# Patient Record
Sex: Female | Born: 1990 | Race: White | Hispanic: No | Marital: Single | Smoking: Current every day smoker
Health system: Southern US, Community
[De-identification: ages and names within clinical notes are randomized; demographics above are authoritative.]

---
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
## 2012-11-19 ENCOUNTER — Other Ambulatory Visit: Payer: Self-pay | Admitting: Family Medicine

## 2012-11-19 DIAGNOSIS — M549 Dorsalgia, unspecified: Secondary | ICD-10-CM

## 2012-11-20 ENCOUNTER — Ambulatory Visit (INDEPENDENT_AMBULATORY_CARE_PROVIDER_SITE_OTHER): Payer: Self-pay

## 2012-11-20 DIAGNOSIS — M549 Dorsalgia, unspecified: Secondary | ICD-10-CM

## 2012-11-20 DIAGNOSIS — M5126 Other intervertebral disc displacement, lumbar region: Secondary | ICD-10-CM

## 2012-11-20 DIAGNOSIS — R937 Abnormal findings on diagnostic imaging of other parts of musculoskeletal system: Secondary | ICD-10-CM

## 2012-11-21 ENCOUNTER — Other Ambulatory Visit: Payer: Self-pay | Admitting: Internal Medicine

## 2012-12-03 ENCOUNTER — Encounter: Payer: Self-pay | Admitting: Sports Medicine

## 2012-12-03 ENCOUNTER — Ambulatory Visit (INDEPENDENT_AMBULATORY_CARE_PROVIDER_SITE_OTHER): Payer: Self-pay | Admitting: Sports Medicine

## 2012-12-03 VITALS — BP 134/72 | HR 97 | Wt 174.0 lb

## 2012-12-03 DIAGNOSIS — M47816 Spondylosis without myelopathy or radiculopathy, lumbar region: Secondary | ICD-10-CM | POA: Insufficient documentation

## 2012-12-03 DIAGNOSIS — M545 Low back pain, unspecified: Secondary | ICD-10-CM

## 2012-12-03 DIAGNOSIS — M5136 Other intervertebral disc degeneration, lumbar region with discogenic back pain only: Secondary | ICD-10-CM

## 2012-12-03 MED ORDER — NAPROXEN 500 MG PO TABS: 500.0000 mg | ORAL_TABLET | Freq: Two times a day (BID) | ORAL | Status: DC
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
## 2012-12-03 NOTE — Assessment & Plan Note (Signed)
She has failed conservative measures in terms of physical therapy and steroids, gabapentin, Ultram. 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 At this point we'll proceed with interventional treatment at the L4-L5 level. We certainly need to keep her SI joint as well as left L4-L5 facet joint in her mind as possible pain generating structures. She'll come back after the injection.

## 2012-12-04 ENCOUNTER — Ambulatory Visit
Admission: RE | Admit: 2012-12-04 | Discharge: 2012-12-04 | Disposition: A | Discharge status: Home / Self Care | Payer: No Typology Code available for payment source | Source: Ambulatory Visit | Attending: Sports Medicine | Admitting: Sports Medicine

## 2012-12-04 MED ORDER — METHYLPREDNISOLONE ACETATE 40 MG/ML INJ SUSP (RADIOLOG
120.0000 mg | Freq: Once | INTRAMUSCULAR | Status: AC
  Administered 2012-12-04: 120 mg via EPIDURAL

## 2012-12-04 MED ORDER — IOHEXOL 180 MG/ML  SOLN
1.0000 mL | Freq: Once | INTRAMUSCULAR | Status: AC | PRN
  Administered 2012-12-04: 1 mL via EPIDURAL

## 2012-12-11 ENCOUNTER — Encounter: Payer: Self-pay | Admitting: *Deleted

## 2012-12-11 ENCOUNTER — Ambulatory Visit (INDEPENDENT_AMBULATORY_CARE_PROVIDER_SITE_OTHER): Payer: Self-pay | Admitting: Sports Medicine

## 2012-12-11 ENCOUNTER — Encounter: Payer: Self-pay | Admitting: Sports Medicine

## 2012-12-11 VITALS — BP 117/68 | HR 107 | Wt 169.0 lb

## 2012-12-11 DIAGNOSIS — M545 Low back pain, unspecified: Secondary | ICD-10-CM

## 2012-12-11 NOTE — Assessment & Plan Note (Signed)
I think at this point we now need to proceed with an interventional left-sided L4-L5 facet injection. She got no benefit, not even temporarily after the epidural. Her pain is now described as sharp in the left side, worse with extension. I will see her back after the left-sided L4-L5 facet block.

## 2012-12-11 NOTE — Progress Notes (Signed)
   Subjective:    CC: Follow up after injection  HPI: This pleasant 22 year old female with degenerative disc disease comes back after L4-L5 interlaminar epidural steroid injection.  Unfortunately she did not note any benefit not even for 5 or 10 minutes after the procedure. She had increasing pain, and then a few days later he returned back to her baseline level. She now continues to describe her pain as sharp, localized in the left side of the low back, worse with extension now, radiating down into the buttock but not past the knee. Her current medications are providing her with some relief. Pain is moderate, stable.  Past medical history, Surgical history, Family history not pertinant except as noted below, Social history, Allergies, and medications have been entered into the medical record, reviewed, and no changes needed.   Review of Systems: No headache, visual changes, nausea, vomiting, diarrhea, constipation, dizziness, abdominal pain, skin rash, fevers, chills, night sweats, weight loss, swollen lymph nodes, body aches, joint swelling, muscle aches, chest pain, shortness of breath, mood changes, visual or auditory hallucinations.   Objective:   General: Well Developed, well nourished, and in no acute distress.  Neuro/Psych: Alert and oriented x3, extra-ocular muscles intact, able to move all 4 extremities, sensation grossly intact. Skin: Warm and dry, no rashes noted.  Respiratory: Not using accessory muscles, speaking in full sentences, trachea midline.  Cardiovascular: Pulses palpable, no extremity edema. Abdomen: Does not appear distended. Back Exam:  Inspection: Unremarkable  Motion: I was able to reproduce the pain with left rotation and extension. SLR laying: Negative  XSLR laying: Negative  Palpable tenderness: None. FABER: negative. Sensory change: Gross sensation intact to all lumbar and sacral dermatomes.  Reflexes: 2+ at both patellar tendons, 2+ at achilles tendons,  Babinski's downgoing.  Strength at foot  Plantar-flexion: 5/5 Dorsi-flexion: 5/5 Eversion: 5/5 Inversion: 5/5  Leg strength  Quad: 5/5 Hamstring: 5/5 Hip flexor: 5/5 Hip abductors: 5/5  Gait unremarkable. I again reviewed the MRI in this patient with transitional anatomy, she does have a disc protrusion at the L4-L5 level based on numbering from the MRI, she also has left-sided L4-L5 facet spondylosis.   Impression and Recommendations:   This case required medical decision making of moderate complexity.

## 2012-12-19 ENCOUNTER — Ambulatory Visit
Admission: RE | Admit: 2012-12-19 | Discharge: 2012-12-19 | Disposition: A | Discharge status: Home / Self Care | Payer: Self-pay | Source: Ambulatory Visit | Attending: Sports Medicine | Admitting: Sports Medicine

## 2012-12-19 MED ORDER — IOHEXOL 180 MG/ML  SOLN
1.0000 mL | Freq: Once | INTRAMUSCULAR | Status: AC | PRN
  Administered 2012-12-19: 1 mL via INTRA_ARTICULAR

## 2012-12-26 ENCOUNTER — Encounter: Payer: Self-pay | Admitting: Sports Medicine

## 2012-12-26 ENCOUNTER — Ambulatory Visit (INDEPENDENT_AMBULATORY_CARE_PROVIDER_SITE_OTHER): Payer: Self-pay | Admitting: Sports Medicine

## 2012-12-26 VITALS — BP 123/70 | HR 86 | Wt 180.0 lb

## 2012-12-26 DIAGNOSIS — M545 Low back pain, unspecified: Secondary | ICD-10-CM

## 2012-12-26 NOTE — Progress Notes (Signed)
   Subjective:    CC: Followup  HPI: Low back pain: I have been seeing Yoshi for pain in her back. She has been through at least 6 sessions of formal physical therapy, oral steroids, NSAIDs, muscle relaxers. We did a L4-L5 interlaminar epidural steroid injection which caused no relief in her pain. Most recently we targeted the left L4-L5 facet joint, she had greater than 1-1/2 days of complete pain relief, and now at least 50% is not 80% percent pain relief on other days.  Unfortunately her pain is returning. Again, she localizes the pain in the left low back, worse with extension, without radiation.  Past medical history, Surgical history, Family history not pertinant except as noted below, Social history, Allergies, and medications have been entered into the medical record, reviewed, and no changes needed.   Review of Systems: No headache, visual changes, nausea, vomiting, diarrhea, constipation, dizziness, abdominal pain, skin rash, fevers, chills, night sweats, weight loss, swollen lymph nodes, body aches, joint swelling, muscle aches, chest pain, shortness of breath, mood changes, visual or auditory hallucinations.   Objective:   General: Well Developed, well nourished, and in no acute distress.  Neuro/Psych: Alert and oriented x3, extra-ocular muscles intact, able to move all 4 extremities, sensation grossly intact. Skin: Warm and dry, no rashes noted.  Respiratory: Not using accessory muscles, speaking in full sentences, trachea midline.  Cardiovascular: Pulses palpable, no extremity edema. Abdomen: Does not appear distended. Back Exam:  Inspection: Unremarkable  Motion: I was able to reproduce the pain with left rotation and extension. SLR laying: Negative  XSLR laying: Negative  Palpable tenderness: None. FABER: negative. Sensory change: Gross sensation intact to all lumbar and sacral dermatomes.  Reflexes: 2+ at both patellar tendons, 2+ at achilles tendons, Babinski's  downgoing.  Strength at foot  Plantar-flexion: 5/5 Dorsi-flexion: 5/5 Eversion: 5/5 Inversion: 5/5  Leg strength  Quad: 5/5 Hamstring: 5/5 Hip flexor: 5/5 Hip abductors: 5/5  Gait unremarkable. I again reviewed the MRI in this patient with transitional anatomy, she does have a disc protrusion at the L4-L5 level based on numbering from the MRI, she also has left-sided L4-L5 facet spondylosis.  Impression and Recommendations:   This case required medical decision making of moderate complexity.

## 2012-12-26 NOTE — Assessment & Plan Note (Addendum)
Patient has already had formal physical therapy. Unsuccessful L4-L5 epidural steroid injection Subsequently, good relief, temporary, for left-sided L4-L5 facet injection based on current MRI numbering scheme. This confirms the left-sided L4-L5 facet as the pain generating structure. I am going to initiate the process of medial branch block and radiofrequency ablation.

## 2012-12-27 ENCOUNTER — Other Ambulatory Visit: Payer: Self-pay | Admitting: Sports Medicine

## 2012-12-27 DIAGNOSIS — M545 Low back pain, unspecified: Secondary | ICD-10-CM

## 2013-01-02 ENCOUNTER — Ambulatory Visit
Admission: RE | Admit: 2013-01-02 | Discharge: 2013-01-02 | Disposition: A | Discharge status: Home / Self Care | Payer: No Typology Code available for payment source | Source: Ambulatory Visit | Attending: Sports Medicine | Admitting: Sports Medicine

## 2013-01-02 ENCOUNTER — Other Ambulatory Visit: Payer: Self-pay | Admitting: Sports Medicine

## 2013-01-02 DIAGNOSIS — M545 Low back pain, unspecified: Secondary | ICD-10-CM

## 2013-01-02 MED ORDER — IOHEXOL 180 MG/ML  SOLN
1.0000 mL | Freq: Once | INTRAMUSCULAR | Status: AC | PRN
  Administered 2013-01-02: 1 mL via INTRA_ARTICULAR

## 2013-01-07 ENCOUNTER — Encounter: Payer: Self-pay | Admitting: *Deleted

## 2013-01-22 ENCOUNTER — Telehealth: Payer: Self-pay | Admitting: *Deleted

## 2013-01-22 NOTE — Telephone Encounter (Signed)
Just got phone call Friday about doing procedure and its not even scheduled yet.

## 2013-01-22 NOTE — Telephone Encounter (Signed)
I will write a note keeping her up for another 2 weeks. It will be in my out basket.

## 2013-01-22 NOTE — Telephone Encounter (Signed)
When does she want to to say return to work.

## 2013-01-22 NOTE — Telephone Encounter (Signed)
Pt calls and wants to know if she could get an extension on her out of work note?

## 2013-01-23 NOTE — Telephone Encounter (Signed)
LMOM that she can come by office and pick up work note. Barry Dienes, LPN

## 2013-02-01 ENCOUNTER — Telehealth: Payer: Self-pay | Admitting: *Deleted

## 2013-02-01 ENCOUNTER — Ambulatory Visit
Admission: RE | Admit: 2013-02-01 | Discharge: 2013-02-01 | Disposition: A | Discharge status: Home / Self Care | Payer: No Typology Code available for payment source | Source: Ambulatory Visit | Attending: Sports Medicine | Admitting: Sports Medicine

## 2013-02-01 VITALS — BP 119/79 | HR 90 | Resp 18

## 2013-02-01 DIAGNOSIS — M545 Low back pain, unspecified: Secondary | ICD-10-CM

## 2013-02-01 MED ORDER — CEFAZOLIN SODIUM 1-5 GM-% IV SOLN: 1.0000 g | Freq: Once | INTRAVENOUS | Status: DC

## 2013-02-01 MED ORDER — FENTANYL CITRATE 0.05 MG/ML IJ SOLN
25.0000 ug | INTRAMUSCULAR | Status: DC | PRN
  Administered 2013-02-01 (×2): 50 ug via INTRAVENOUS

## 2013-02-01 MED ORDER — KETOROLAC TROMETHAMINE 30 MG/ML IJ SOLN
30.0000 mg | Freq: Once | INTRAMUSCULAR | Status: AC
  Administered 2013-02-01: 30 mg via INTRAVENOUS

## 2013-02-01 MED ORDER — CEFAZOLIN SODIUM-DEXTROSE 2-3 GM-% IV SOLR: 2.0000 g | Freq: Once | INTRAVENOUS | Status: DC

## 2013-02-01 MED ORDER — MIDAZOLAM HCL 2 MG/2ML IJ SOLN
1.0000 mg | INTRAMUSCULAR | Status: DC | PRN
  Administered 2013-02-01 (×2): 1 mg via INTRAVENOUS
  Administered 2013-02-01: 0.5 mg via INTRAVENOUS

## 2013-02-01 MED ORDER — SODIUM CHLORIDE 0.9 % IV SOLN
Freq: Once | INTRAVENOUS | Status: AC
  Administered 2013-02-01: 10:00:00 via INTRAVENOUS

## 2013-02-01 NOTE — Progress Notes (Signed)
Patient ambulating slowly but without difficulty.  jkl

## 2013-02-01 NOTE — Progress Notes (Signed)
Discharge instructions explained. 

## 2013-02-01 NOTE — Telephone Encounter (Signed)
Pt calls and states that she is another work note- hers will run out Tuesday and does not have appt yet for injections. Barry Dienes, LPN

## 2013-02-01 NOTE — Telephone Encounter (Signed)
Since we have had this problem multiple times I have written a note with an open-ended return to work day, she just needs to let me know her return to work date and I will write a specific note for that. Until then, her note is in my outbox.

## 2013-02-01 NOTE — Progress Notes (Signed)
Mitch at bedside.  Patient resting quietly after procedure.  jkl

## 2013-02-05 NOTE — Telephone Encounter (Signed)
Left VM of MD instructions. Barry Dienes, LPN

## 2013-02-07 ENCOUNTER — Telehealth: Payer: Self-pay | Admitting: Radiology

## 2013-02-07 NOTE — Telephone Encounter (Signed)
Pt asked if it was a side effect of the procedure to be nauseated. Explained that that was not a side effect we usually saw. Also, wanted to know how long she would be sore. Explained it depended on person.some were only sore about a week, some it could take as long as 3 weeks.

## 2013-02-08 ENCOUNTER — Ambulatory Visit: Payer: Self-pay | Admitting: Sports Medicine

## 2013-02-12 ENCOUNTER — Encounter: Payer: Self-pay | Admitting: Sports Medicine

## 2013-02-12 ENCOUNTER — Ambulatory Visit (INDEPENDENT_AMBULATORY_CARE_PROVIDER_SITE_OTHER): Payer: Self-pay | Admitting: Sports Medicine

## 2013-02-12 VITALS — BP 116/79 | HR 103 | Wt 181.0 lb

## 2013-02-12 DIAGNOSIS — M545 Low back pain: Secondary | ICD-10-CM

## 2013-02-12 DIAGNOSIS — M5459 Other low back pain: Secondary | ICD-10-CM

## 2013-02-12 DIAGNOSIS — M5380 Other specified dorsopathies, site unspecified: Secondary | ICD-10-CM

## 2013-02-12 MED ORDER — PREDNISONE 50 MG PO TABS: ORAL_TABLET | ORAL | Status: DC

## 2013-02-12 MED ORDER — PROMETHAZINE HCL 25 MG PO TABS: 25.0000 mg | ORAL_TABLET | Freq: Four times a day (QID) | ORAL | Status: DC | PRN

## 2013-02-12 MED ORDER — HYDROCODONE-ACETAMINOPHEN 5-325 MG PO TABS: 1.0000 | ORAL_TABLET | Freq: Three times a day (TID) | ORAL | Status: DC | PRN

## 2013-02-12 MED ORDER — CYCLOBENZAPRINE HCL 10 MG PO TABS: 10.0000 mg | ORAL_TABLET | Freq: Three times a day (TID) | ORAL | Status: DC | PRN

## 2013-02-12 NOTE — Progress Notes (Signed)
  Subjective:    CC: Followup  HPI: Lumbar facetogenic pain: Bonny is now status post radiofrequency ablation of the left L4-L5 facet joint. She is currently a little sore, but overall the sharp, facetogenic pain has resolved. She does need some more medication.  Past medical history, Surgical history, Family history not pertinant except as noted below, Social history, Allergies, and medications have been entered into the medical record, reviewed, and no changes needed.   Review of Systems: No fevers, chills, night sweats, weight loss, chest pain, or shortness of breath.   Objective:    General: Well Developed, well nourished, and in no acute distress.  Neuro: Alert and oriented x3, extra-ocular muscles intact, sensation grossly intact.  HEENT: Normocephalic, atraumatic, pupils equal round reactive to light, neck supple, no masses, no lymphadenopathy, thyroid nonpalpable.  Skin: Warm and dry, no rashes. Cardiac: Regular rate and rhythm, no murmurs rubs or gallops, no lower extremity edema.  Respiratory: Clear to auscultation bilaterally. Not using accessory muscles, speaking in full sentences.  Impression and Recommendations:

## 2013-02-12 NOTE — Assessment & Plan Note (Addendum)
Status post left-sided L4-L5 radiofrequency ablation. Pain is improved although she is having a little bit of spasm. This is expected for several weeks after the procedure. She can stop her gabapentin naproxen. I think that the spasm is probably normal after the radiofrequency ablation. I going to give her a small amount of hydrocodone, and Flexeril, as well as 5 days of prednisone. She'll come back to see me in one month.

## 2013-03-12 ENCOUNTER — Ambulatory Visit (INDEPENDENT_AMBULATORY_CARE_PROVIDER_SITE_OTHER): Payer: Self-pay | Admitting: Sports Medicine

## 2013-03-12 ENCOUNTER — Encounter: Payer: Self-pay | Admitting: Sports Medicine

## 2013-03-12 VITALS — BP 124/85 | HR 93 | Wt 185.0 lb

## 2013-03-12 DIAGNOSIS — M5380 Other specified dorsopathies, site unspecified: Secondary | ICD-10-CM

## 2013-03-12 DIAGNOSIS — M545 Low back pain: Secondary | ICD-10-CM

## 2013-03-12 DIAGNOSIS — M5459 Other low back pain: Secondary | ICD-10-CM

## 2013-03-12 MED ORDER — AMITRIPTYLINE HCL 50 MG PO TABS: ORAL_TABLET | ORAL | Status: DC

## 2013-03-12 NOTE — Progress Notes (Signed)
  Subjective:    CC: Followup  HPI: Low back pain: I have been seeing Debbie Gross for some time now, she had a left-sided L4-L5 facet radiofrequency ablation provided about a month and a half relief, unfortunately she continues to have pain which she localizes actually on both sides of her low back, hips, and upper back and shoulders. She also notes that her mood has been somewhat depressed and she's having difficulty finding a job. Symptoms are moderate, persistent. She denies any suicidal or homicidal ideation, notes symptoms are worse at that time when she has difficulty sleeping, notes a poor energy, poor concentration. She does have a primary care provider who she has not yet seen for this.  Past medical history, Surgical history, Family history not pertinant except as noted below, Social history, Allergies, and medications have been entered into the medical record, reviewed, and no changes needed.   Review of Systems: No fevers, chills, night sweats, weight loss, chest pain, or shortness of breath.   Objective:    General: Well Developed, well nourished, and in no acute distress.  Neuro: Alert and oriented x3, extra-ocular muscles intact, sensation grossly intact.  HEENT: Normocephalic, atraumatic, pupils equal round reactive to light, neck supple, no masses, no lymphadenopathy, thyroid nonpalpable.  Skin: Warm and dry, no rashes. Cardiac: Regular rate and rhythm, no murmurs rubs or gallops, no lower extremity edema.  Respiratory: Clear to auscultation bilaterally. Not using accessory muscles, speaking in full sentences. Back Exam:  Inspection: Unremarkable  Motion: Flexion 45 deg, Extension 45 deg, Side Bending to 45 deg bilaterally,  Rotation to 45 deg bilaterally  SLR laying: Negative  XSLR laying: Negative  Palpable tenderness: None. FABER: negative. Sensory change: Gross sensation intact to all lumbar and sacral dermatomes.  Reflexes: 2+ at both patellar tendons, 2+ at achilles  tendons, Babinski's downgoing.  Strength at foot  Plantar-flexion: 5/5 Dorsi-flexion: 5/5 Eversion: 5/5 Inversion: 5/5  Leg strength  Quad: 5/5 Hamstring: 5/5 Hip flexor: 5/5 Hip abductors: 5/5  Gait unremarkable. Impression and Recommendations:

## 2013-03-12 NOTE — Assessment & Plan Note (Addendum)
Good response for a month and a half with L4-L5 left-sided radiofrequency denervation of the facet joint. Unfortunately some of her pain is coming back. I do think a large amount of her symptoms are coming from uncontrolled mood disorder. She will followup with her primary care provider for this. I am going to start Elavil at bedtime. Return in one month.

## 2013-04-09 ENCOUNTER — Ambulatory Visit: Payer: Self-pay | Admitting: Sports Medicine

## 2013-04-24 ENCOUNTER — Ambulatory Visit (INDEPENDENT_AMBULATORY_CARE_PROVIDER_SITE_OTHER): Payer: Self-pay | Admitting: Sports Medicine

## 2013-04-24 ENCOUNTER — Encounter: Payer: Self-pay | Admitting: Sports Medicine

## 2013-04-24 VITALS — BP 102/73 | HR 78 | Wt 184.0 lb

## 2013-04-24 DIAGNOSIS — M5380 Other specified dorsopathies, site unspecified: Secondary | ICD-10-CM

## 2013-04-24 DIAGNOSIS — M5459 Other low back pain: Secondary | ICD-10-CM

## 2013-04-24 DIAGNOSIS — M545 Low back pain: Secondary | ICD-10-CM

## 2013-04-24 MED ORDER — CITALOPRAM HYDROBROMIDE 20 MG PO TABS: 20.0000 mg | ORAL_TABLET | Freq: Every day | ORAL | Status: DC

## 2013-04-24 NOTE — Progress Notes (Signed)
  Subjective:    CC: Follow up  HPI: Low back pain: Status post epidural injection as well as left-sided L4-L5 facet radiofrequency ablation which he for only a few days of improvement. I thought that disorder was contributory her pain, and after followup with her primary care provider regarding this. She has not yet been back to see her primary care provider, and does endorse depression without suicidality. Pain continues in the back radiating down the lateral aspect of the left buttock, but not past the knee. Moderate, persistent.  Past medical history, Surgical history, Family history not pertinant except as noted below, Social history, Allergies, and medications have been entered into the medical record, reviewed, and no changes needed.   Review of Systems: No fevers, chills, night sweats, weight loss, chest pain, or shortness of breath.   Objective:    General: Well Developed, well nourished, and in no acute distress.  Neuro: Alert and oriented x3, extra-ocular muscles intact, sensation grossly intact.  HEENT: Normocephalic, atraumatic, pupils equal round reactive to light, neck supple, no masses, no lymphadenopathy, thyroid nonpalpable.  Skin: Warm and dry, no rashes. Cardiac: Regular rate and rhythm, no murmurs rubs or gallops, no lower extremity edema.  Respiratory: Clear to auscultation bilaterally. Not using accessory muscles, speaking in full sentences. Back Exam:  Inspection: Unremarkable  Motion: Flexion 45 deg, Extension 45 deg, Side Bending to 45 deg bilaterally,  Rotation to 45 deg bilaterally  SLR laying: Negative  XSLR laying: Negative  Palpable tenderness: None. FABER: negative. Sensory change: Gross sensation intact to all lumbar and sacral dermatomes.  Reflexes: 2+ at both patellar tendons, 2+ at achilles tendons, Babinski's downgoing.  Strength at foot  Plantar-flexion: 5/5 Dorsi-flexion: 5/5 Eversion: 5/5 Inversion: 5/5  Leg strength  Quad: 5/5 Hamstring: 5/5  Hip flexor: 5/5 Hip abductors: 5/5  Gait unremarkable.   Impression and Recommendations:

## 2013-04-24 NOTE — Assessment & Plan Note (Signed)
Persistent back pain despite left L4-L5 facet radiofrequency ablation. No response to amitriptyline, discontinuing this. She's not yet seen her primary care provider regarding mood disorder, I am going to start citalopram and she will follow this up with her PCP. She will ice her back and use naproxen. Return to see me in about 3 weeks.  She did want to discuss her knee, I'm going to provide some exercises and we will discuss this further at a future visit.

## 2013-05-15 ENCOUNTER — Ambulatory Visit (INDEPENDENT_AMBULATORY_CARE_PROVIDER_SITE_OTHER): Payer: Self-pay | Admitting: Sports Medicine

## 2013-05-15 ENCOUNTER — Encounter: Payer: Self-pay | Admitting: Sports Medicine

## 2013-05-15 VITALS — BP 115/75 | HR 74 | Wt 186.0 lb

## 2013-05-15 DIAGNOSIS — M5380 Other specified dorsopathies, site unspecified: Secondary | ICD-10-CM

## 2013-05-15 DIAGNOSIS — M545 Low back pain: Secondary | ICD-10-CM

## 2013-05-15 DIAGNOSIS — M5459 Other low back pain: Secondary | ICD-10-CM

## 2013-05-15 NOTE — Assessment & Plan Note (Signed)
Good response to citalopram, happier, less stressed out, back pain has improved. She is going to make a followup with her primary care provider for continuation and followup with this medication. She certainly has lumbar facet spondylosis, but it sounds as though a great deal of her pain may be myofascial as well.

## 2013-05-15 NOTE — Progress Notes (Signed)
  Subjective:    CC: Follow up  HPI: Low back pain:  Moderate response after L4-5 facet radiofrequency ablation, unfortunately she continued to have pain. She did have some symptoms suggestive of depression, base on the neurotransmitter hypothesis I added citalopram, and she returns almost pain free, injury happy.  Past medical history, Surgical history, Family history not pertinant except as noted below, Social history, Allergies, and medications have been entered into the medical record, reviewed, and no changes needed.   Review of Systems: No fevers, chills, night sweats, weight loss, chest pain, or shortness of breath.   Objective:    General: Well Developed, well nourished, and in no acute distress.  Neuro: Alert and oriented x3, extra-ocular muscles intact, sensation grossly intact.  HEENT: Normocephalic, atraumatic, pupils equal round reactive to light, neck supple, no masses, no lymphadenopathy, thyroid nonpalpable.  Skin: Warm and dry, no rashes. Cardiac: Regular rate and rhythm, no murmurs rubs or gallops, no lower extremity edema.  Respiratory: Clear to auscultation bilaterally. Not using accessory muscles, speaking in full sentences.  Impression and Recommendations:

## 2013-06-26 ENCOUNTER — Ambulatory Visit (INDEPENDENT_AMBULATORY_CARE_PROVIDER_SITE_OTHER): Payer: Self-pay | Admitting: Sports Medicine

## 2013-06-26 ENCOUNTER — Encounter: Payer: Self-pay | Admitting: Sports Medicine

## 2013-06-26 VITALS — BP 117/74 | HR 87 | Wt 185.0 lb

## 2013-06-26 DIAGNOSIS — F329 Major depressive disorder, single episode, unspecified: Secondary | ICD-10-CM

## 2013-06-26 DIAGNOSIS — IMO0001 Reserved for inherently not codable concepts without codable children: Secondary | ICD-10-CM

## 2013-06-26 DIAGNOSIS — M7918 Myalgia, other site: Secondary | ICD-10-CM | POA: Insufficient documentation

## 2013-06-26 MED ORDER — PREGABALIN 50 MG PO CAPS: 50.0000 mg | ORAL_CAPSULE | Freq: Two times a day (BID) | ORAL | Status: DC

## 2013-06-26 NOTE — Assessment & Plan Note (Signed)
Increasing to 30 mg of citalopram. She has still not yet seen her PCP, but will follow this up with her.

## 2013-06-26 NOTE — Assessment & Plan Note (Signed)
Breane has now had insufficient responses to an epidural as well as facet blocks as well as facet radiofrequency ablation. Her pain did improve with starting citalopram, we are going to increase to 30 mg daily, she will continue to follow this up with her PCP. I'm also going to add some Lyrica samples, like to see her back in one month to see if things are going, she can also call me and let me know when she needs refills.

## 2013-06-26 NOTE — Progress Notes (Signed)
  Subjective:    CC: Followup  HPI: Low back pain: Debbie Gross has been through a multitude of scans, including MRI, she's had a multitude of invasive interventional procedures including epidurals, facet injections, and radiofrequency ablation, unfortunately her pain is persistent. More recently I started her on citalopram, and this improved her back pain. She's currently fairly happy but does still endorse some symptoms of depressed mood, poor concentration, anhedonia, but denies suicidal or homicidal ideation. Her back pain is improved, but is persistent, and she localizes it on both shoulders, both sides of her low back, and hips.  Impression: Improved significantly with 20 mg of citalopram, she is amenable to going up to 30.  Past medical history, Surgical history, Family history not pertinant except as noted below, Social history, Allergies, and medications have been entered into the medical record, reviewed, and no changes needed.   Review of Systems: No fevers, chills, night sweats, weight loss, chest pain, or shortness of breath.   Objective:    General: Well Developed, well nourished, and in no acute distress.  Neuro: Alert and oriented x3, extra-ocular muscles intact, sensation grossly intact.  HEENT: Normocephalic, atraumatic, pupils equal round reactive to light, neck supple, no masses, no lymphadenopathy, thyroid nonpalpable.  Skin: Warm and dry, no rashes. Cardiac: Regular rate and rhythm, no murmurs rubs or gallops, no lower extremity edema.  Respiratory: Clear to auscultation bilaterally. Not using accessory muscles, speaking in full sentences.  Impression and Recommendations:

## 2013-07-29 ENCOUNTER — Ambulatory Visit: Payer: Self-pay | Admitting: Sports Medicine

## 2013-08-14 ENCOUNTER — Ambulatory Visit: Payer: Self-pay | Admitting: Sports Medicine

## 2017-03-27 ENCOUNTER — Ambulatory Visit (INDEPENDENT_AMBULATORY_CARE_PROVIDER_SITE_OTHER): Payer: Medicaid Other

## 2017-03-27 ENCOUNTER — Ambulatory Visit (INDEPENDENT_AMBULATORY_CARE_PROVIDER_SITE_OTHER): Payer: Medicaid Other | Admitting: Sports Medicine

## 2017-03-27 ENCOUNTER — Encounter: Payer: Self-pay | Admitting: Sports Medicine

## 2017-03-27 DIAGNOSIS — M47816 Spondylosis without myelopathy or radiculopathy, lumbar region: Secondary | ICD-10-CM | POA: Diagnosis not present

## 2017-03-27 DIAGNOSIS — G8929 Other chronic pain: Secondary | ICD-10-CM | POA: Diagnosis not present

## 2017-03-27 DIAGNOSIS — M545 Low back pain: Secondary | ICD-10-CM

## 2017-03-27 DIAGNOSIS — R200 Anesthesia of skin: Secondary | ICD-10-CM | POA: Diagnosis not present

## 2017-03-27 DIAGNOSIS — G5603 Carpal tunnel syndrome, bilateral upper limbs: Secondary | ICD-10-CM | POA: Diagnosis not present

## 2017-03-27 MED ORDER — PREDNISONE 50 MG PO TABS: ORAL_TABLET | ORAL | 0 refills | Status: DC

## 2017-03-27 MED ORDER — MELOXICAM 15 MG PO TABS: ORAL_TABLET | ORAL | 3 refills | Status: AC

## 2017-03-27 NOTE — Progress Notes (Signed)
   Subjective:    I'm seeing this patient as a consultation for:  Debbie DickerErin Judge, NP  CC: Numbness and tingling of the hands, back pain  HPI: This is a pleasant 26 year old female, she recently had a baby, during the entire pregnancy and afterward she complained of numbness and tingling in both hands, weakness, dropping things, symptoms are worse nocturnally. Has never been treated or evaluated for carpal tunnel syndrome.  Low back pain: Patient is super morbidly obese, we have treated her in the past for lumbar facet syndrome, including radiofrequency ablation. She did well, more recently she's gained about 30 pounds and has noted worsening of her back pain. No bowel or bladder dysfunction, saddle numbness, constitutional symptoms.  Past medical history, Surgical history, Family history not pertinant except as noted below, Social history, Allergies, and medications have been entered into the medical record, reviewed, and no changes needed.   Review of Systems: No headache, visual changes, nausea, vomiting, diarrhea, constipation, dizziness, abdominal pain, skin rash, fevers, chills, night sweats, weight loss, swollen lymph nodes, body aches, joint swelling, muscle aches, chest pain, shortness of breath, mood changes, visual or auditory hallucinations.   Objective:   General: Well Developed, well nourished, and in no acute distress.  Neuro:  Extra-ocular muscles intact, able to move all 4 extremities, sensation grossly intact.  Deep tendon reflexes tested were normal. Psych: Alert and oriented, mood congruent with affect. ENT:  Ears and nose appear unremarkable.  Hearing grossly normal. Neck: Unremarkable overall appearance, trachea midline.  No visible thyroid enlargement. Eyes: Conjunctivae and lids appear unremarkable.  Pupils equal and round. Skin: Warm and dry, no rashes noted.  Cardiovascular: Pulses palpable, no extremity edema. Bilateral wrists: Inspection normal with no visible  erythema or swelling. ROM smooth and normal with good flexion and extension and ulnar/radial deviation that is symmetrical with opposite wrist. Palpation is normal over metacarpals, navicular, lunate, and TFCC; tendons without tenderness/ swelling No snuffbox tenderness. No tenderness over Canal of Guyon. Strength 5/5 in all directions without pain. Positive tinel's and phalens signs. Negative Finkelstein sign. Negative Watson's test. Back Exam:  Inspection: Unremarkable  Motion: Flexion 45 deg, Extension 45 deg, Side Bending to 45 deg bilaterally,  Rotation to 45 deg bilaterally  SLR laying: Negative  XSLR laying: Negative  Palpable tenderness: None. FABER: negative. Sensory change: Gross sensation intact to all lumbar and sacral dermatomes.  Reflexes: 2+ at both patellar tendons, 2+ at achilles tendons, Babinski's downgoing.  Strength at foot  Plantar-flexion: 5/5 Dorsi-flexion: 5/5 Eversion: 5/5 Inversion: 5/5  Leg strength  Quad: 5/5 Hamstring: 5/5 Hip flexor: 5/5 Hip abductors: 5/5  Gait unremarkable.  Impression and Recommendations:   This case required medical decision making of moderate complexity.  Morbid obesity (HCC) Referral for bariatric surgery  Carpal tunnel syndrome, bilateral Bilateral nighttime splint for one month, median nerve hydrodissection if no better.  Lumbar spondylosis Moderate response to left L4-L5 facet radio frequency ablation. It's been a solid four years, we're going to start conservative treatment again with physical therapy, prednisone, meloxicam. Return in one month, MRI for interventional planning if no better, pain is discogenic.

## 2017-03-27 NOTE — Assessment & Plan Note (Signed)
Bilateral nighttime splint for one month, median nerve hydrodissection if no better.

## 2017-03-27 NOTE — Assessment & Plan Note (Signed)
Referral for bariatric surgery. 

## 2017-03-27 NOTE — Assessment & Plan Note (Signed)
Moderate response to left L4-L5 facet radio frequency ablation. It's been a solid four years, we're going to start conservative treatment again with physical therapy, prednisone, meloxicam. Return in one month, MRI for interventional planning if no better, pain is discogenic.

## 2017-05-08 ENCOUNTER — Ambulatory Visit: Payer: Medicaid Other | Admitting: Sports Medicine

## 2017-05-09 ENCOUNTER — Ambulatory Visit (INDEPENDENT_AMBULATORY_CARE_PROVIDER_SITE_OTHER): Payer: Medicaid Other | Admitting: Sports Medicine

## 2017-05-09 ENCOUNTER — Encounter: Payer: Self-pay | Admitting: Sports Medicine

## 2017-05-09 DIAGNOSIS — M47816 Spondylosis without myelopathy or radiculopathy, lumbar region: Secondary | ICD-10-CM | POA: Diagnosis not present

## 2017-05-09 DIAGNOSIS — G5603 Carpal tunnel syndrome, bilateral upper limbs: Secondary | ICD-10-CM | POA: Diagnosis not present

## 2017-05-09 MED ORDER — GABAPENTIN 300 MG PO CAPS: ORAL_CAPSULE | ORAL | 3 refills | Status: DC

## 2017-05-09 NOTE — Progress Notes (Signed)
  Subjective:    CC: Follow-up   HPI: Bilateral hand numbness and tingling: Suspected to be carpal tunnel syndrome however she got no relief from bilateral nighttime splinting.  Low back pain: Super morbidly obese, axial and discogenic back pain, now having some leg numbness but no bowel or bladder dysfunction, saddle numbness. She has failed greater than 6 weeks of physical therapy, NSAIDs, steroids at this point.  Morbid obesity: Has set her personal goal of 25 pounds weight loss by January 2019.  Past medical history:  Negative.  See flowsheet/record as well for more information.  Surgical history: Negative.  See flowsheet/record as well for more information.  Family history: Negative.  See flowsheet/record as well for more information.  Social history: Negative.  See flowsheet/record as well for more information.  Allergies, and medications have been entered into the medical record, reviewed, and no changes needed.   Review of Systems: No fevers, chills, night sweats, weight loss, chest pain, or shortness of breath.   Objective:    General: Well Developed, well nourished, and in no acute distress.  Neuro: Alert and oriented x3, extra-ocular muscles intact, sensation grossly intact.  HEENT: Normocephalic, atraumatic, pupils equal round reactive to light, neck supple, no masses, no lymphadenopathy, thyroid nonpalpable.  Skin: Warm and dry, no rashes. Cardiac: Regular rate and rhythm, no murmurs rubs or gallops, no lower extremity edema.  Respiratory: Clear to auscultation bilaterally. Not using accessory muscles, speaking in full sentences.  Impression and Recommendations:    Carpal tunnel syndrome, bilateral No improvement with nighttime splinting. She is given recent symptoms that are consistent with carpal tunnel syndrome so we are going to proceed with a nerve conduction//EMG. Adding gabapentin for relief until the nerve conduction study happens.  Lumbar spondylosis Moderate  response to a left L4-L5 radial frequency ablation years ago, unfortunately she is having a recurrence of pain, this time discogenic, did not respond to physical therapy, prednisone, meloxicam. It's been 6 weeks now, so we are go to proceed with MRI for interventional planning.  Morbid obesity (HCC) I did ask her to touch base with bariatric surgery, she is going to set a weight loss by 25 pounds by January. I would like to see her hit this goal though I don't think that it will be enough weight loss.  I spent 25 minutes with this patient, greater than 50% was face-to-face time counseling regarding the above diagnoses

## 2017-05-09 NOTE — Assessment & Plan Note (Signed)
I did ask her to touch base with bariatric surgery, she is going to set a weight loss by 25 pounds by January. I would like to see her hit this goal though I don't think that it will be enough weight loss.

## 2017-05-09 NOTE — Assessment & Plan Note (Signed)
Moderate response to a left L4-L5 radial frequency ablation years ago, unfortunately she is having a recurrence of pain, this time discogenic, did not respond to physical therapy, prednisone, meloxicam. It's been 6 weeks now, so we are go to proceed with MRI for interventional planning.

## 2017-05-09 NOTE — Assessment & Plan Note (Signed)
No improvement with nighttime splinting. She is given recent symptoms that are consistent with carpal tunnel syndrome so we are going to proceed with a nerve conduction//EMG. Adding gabapentin for relief until the nerve conduction study happens.

## 2017-05-23 ENCOUNTER — Telehealth: Payer: Self-pay

## 2017-05-23 NOTE — Telephone Encounter (Signed)
Auth was approved on 05/10/17, imaging attempted to contact the same day with no answer. Imaging will attempt to re contact Pt.

## 2017-05-23 NOTE — Telephone Encounter (Signed)
Pt left VM stating she still hasn't received a call for her MRI appointment. Please assist.

## 2017-06-02 ENCOUNTER — Encounter: Payer: Self-pay | Admitting: Sports Medicine

## 2017-06-02 DIAGNOSIS — R200 Anesthesia of skin: Secondary | ICD-10-CM

## 2017-06-02 DIAGNOSIS — R202 Paresthesia of skin: Principal | ICD-10-CM

## 2017-06-05 ENCOUNTER — Ambulatory Visit (INDEPENDENT_AMBULATORY_CARE_PROVIDER_SITE_OTHER): Payer: Medicaid Other

## 2017-06-05 DIAGNOSIS — M47816 Spondylosis without myelopathy or radiculopathy, lumbar region: Secondary | ICD-10-CM

## 2017-06-05 DIAGNOSIS — M5116 Intervertebral disc disorders with radiculopathy, lumbar region: Secondary | ICD-10-CM | POA: Diagnosis not present

## 2017-06-06 ENCOUNTER — Ambulatory Visit: Payer: Medicaid Other | Admitting: Sports Medicine

## 2017-06-07 ENCOUNTER — Encounter: Payer: Self-pay | Admitting: Sports Medicine

## 2017-06-07 ENCOUNTER — Ambulatory Visit (INDEPENDENT_AMBULATORY_CARE_PROVIDER_SITE_OTHER): Payer: Medicaid Other | Admitting: Sports Medicine

## 2017-06-07 DIAGNOSIS — G5603 Carpal tunnel syndrome, bilateral upper limbs: Secondary | ICD-10-CM

## 2017-06-07 DIAGNOSIS — F321 Major depressive disorder, single episode, moderate: Secondary | ICD-10-CM

## 2017-06-07 DIAGNOSIS — M47816 Spondylosis without myelopathy or radiculopathy, lumbar region: Secondary | ICD-10-CM

## 2017-06-07 MED ORDER — GABAPENTIN 100 MG PO CAPS: ORAL_CAPSULE | ORAL | 11 refills | Status: AC

## 2017-06-07 MED ORDER — DULOXETINE HCL 30 MG PO CPEP: 30.0000 mg | ORAL_CAPSULE | Freq: Every day | ORAL | 3 refills | Status: AC

## 2017-06-07 NOTE — Assessment & Plan Note (Signed)
No improvement so far with nighttime splinting. There is some suspicion that symptoms are coming from cervical radiculitis versus carpal tunnel syndrome, I ordered a nerve conduction study/EMG, still awaiting this. She is also noted that gabapentin is causing excessive sedation at 300 mg, dropping to 100 mg 1-3 times per day.

## 2017-06-07 NOTE — Assessment & Plan Note (Signed)
Still has desiccation of the L4-L5 disc by current numbering system. Improved from the last MRI. I'm going to hold off on epidural's for now, I'm simply going to switch her from Celexa to Cymbalta for the noradrenergic pain relief. Return in one month.

## 2017-06-07 NOTE — Progress Notes (Signed)
  Subjective:    CC: follow-up  HPI: Hand paresthesias: Still has not obtained nerve conduction study.  Low back pain: With L4-L5 DDD, she has really been inconsistent with her citalopram and BuSpar, and does understand at this point that anxiety, depression and pain are closely interrelated. She is agreeable to switch to something else with noradrenergic effect as well. No suicidal or homicidal ideation, and further treatment of her depression will be with her PCP, although I will augment the dose over time as well based on her pain.  Past medical history:  Negative.  See flowsheet/record as well for more information.  Surgical history: Negative.  See flowsheet/record as well for more information.  Family history: Negative.  See flowsheet/record as well for more information.  Social history: Negative.  See flowsheet/record as well for more information.  Allergies, and medications have been entered into the medical record, reviewed, and no changes needed.   Review of Systems: No fevers, chills, night sweats, weight loss, chest pain, or shortness of breath.   Objective:    General: Well Developed, well nourished, and in no acute distress.  Neuro: Alert and oriented x3, extra-ocular muscles intact, sensation grossly intact.  HEENT: Normocephalic, atraumatic, pupils equal round reactive to light, neck supple, no masses, no lymphadenopathy, thyroid nonpalpable.  Skin: Warm and dry, no rashes. Cardiac: Regular rate and rhythm, no murmurs rubs or gallops, no lower extremity edema.  Respiratory: Clear to auscultation bilaterally. Not using accessory muscles, speaking in full sentences.  Impression and Recommendations:    Carpal tunnel syndrome, bilateral No improvement so far with nighttime splinting. There is some suspicion that symptoms are coming from cervical radiculitis versus carpal tunnel syndrome, I ordered a nerve conduction study/EMG, still awaiting this. She is also noted that  gabapentin is causing excessive sedation at 300 mg, dropping to 100 mg 1-3 times per day.  Lumbar spondylosis Still has desiccation of the L4-L5 disc by current numbering system. Improved from the last MRI. I'm going to hold off on epidural's for now, I'm simply going to switch her from Celexa to Cymbalta for the noradrenergic pain relief. Return in one month.  Major depression With depression and anxiety, was on 40 mg of citalopram as well as BuSpar, really taking it intermittently and often none at all. I'm going to switch her to Cymbalta for the noradrenergic pain relief as well as improvement in her depression and anxiety. We will start at 30 mg and she understands that we will give the due diligence monthly of tapering it up to the full 120 if needed.  I spent 25 minutes with this patient, greater than 50% was face-to-face time counseling regarding the above diagnoses ___________________________________________ Ihor Austin. Benjamin Stain, M.D., ABFM., CAQSM. Primary Care and Sports Medicine [facility]  Adjunct Instructor of Family Medicine  [employer]

## 2017-06-07 NOTE — Assessment & Plan Note (Signed)
With depression and anxiety, was on 40 mg of citalopram as well as BuSpar, really taking it intermittently and often none at all. I'm going to switch her to Cymbalta for the noradrenergic pain relief as well as improvement in her depression and anxiety. We will start at 30 mg and she understands that we will give the due diligence monthly of tapering it up to the full 120 if needed.

## 2017-07-04 ENCOUNTER — Ambulatory Visit (INDEPENDENT_AMBULATORY_CARE_PROVIDER_SITE_OTHER): Payer: Medicaid Other | Admitting: Neurology

## 2017-07-04 ENCOUNTER — Ambulatory Visit (INDEPENDENT_AMBULATORY_CARE_PROVIDER_SITE_OTHER): Payer: Self-pay | Admitting: Neurology

## 2017-07-04 ENCOUNTER — Encounter: Payer: Self-pay | Admitting: Neurology

## 2017-07-04 DIAGNOSIS — R202 Paresthesia of skin: Secondary | ICD-10-CM | POA: Diagnosis not present

## 2017-07-04 DIAGNOSIS — G5603 Carpal tunnel syndrome, bilateral upper limbs: Secondary | ICD-10-CM

## 2017-07-04 NOTE — Procedures (Signed)
     HISTORY:  Debbie Bilisiffany Gross is a 26 year old patient with a two-year history of intermittent right greater than left upper extremity numbness and paresthesias that go from the shoulder level down to the hand. The symptoms will come and go, unassociated with definite weakness. She does report some mild neck discomfort. The patient is being evaluated for a possible neuropathy or a cervical radiculopathy.  NERVE CONDUCTION STUDIES:  Nerve conduction studies were performed on both upper extremities. The distal motor latencies and motor amplitudes for the median and ulnar nerves were within normal limits. The F wave latencies and nerve conduction velocities for these nerves were also normal. The sensory latencies for the median and ulnar nerves were normal.   EMG STUDIES:  EMG study was performed on the right upper extremity:  The first dorsal interosseous muscle reveals 2 to 4 K units with full recruitment. No fibrillations or positive waves were noted. The abductor pollicis brevis muscle reveals 2 to 4 K units with full recruitment. No fibrillations or positive waves were noted. The extensor indicis proprius muscle reveals 1 to 3 K units with full recruitment. No fibrillations or positive waves were noted. The pronator teres muscle reveals 2 to 3 K units with full recruitment. No fibrillations or positive waves were noted. The biceps muscle reveals 1 to 2 K units with full recruitment. No fibrillations or positive waves were noted. The triceps muscle reveals 2 to 4 K units with full recruitment. No fibrillations or positive waves were noted. The anterior deltoid muscle reveals 2 to 3 K units with full recruitment. No fibrillations or positive waves were noted. The cervical paraspinal muscles were tested at 2 levels. No abnormalities of insertional activity were seen at either level tested. There was good relaxation.  EMG study was performed on the left upper extremity:  The first dorsal  interosseous muscle reveals 2 to 4 K units with full recruitment. No fibrillations or positive waves were noted. The abductor pollicis brevis muscle reveals 2 to 4 K units with full recruitment. No fibrillations or positive waves were noted. The extensor indicis proprius muscle reveals 1 to 3 K units with full recruitment. No fibrillations or positive waves were noted. The pronator teres muscle reveals 2 to 3 K units with full recruitment. No fibrillations or positive waves were noted. The biceps muscle reveals 1 to 2 K units with full recruitment. No fibrillations or positive waves were noted. The triceps muscle reveals 2 to 4 K units with full recruitment. No fibrillations or positive waves were noted. The anterior deltoid muscle reveals 2 to 3 K units with full recruitment. No fibrillations or positive waves were noted. The cervical paraspinal muscles were tested at 2 levels. No abnormalities of insertional activity were seen at either level tested. There was good relaxation.   IMPRESSION:  Nerve conduction studies done on both upper extremities were within normal limits. No evidence of a neuropathy is seen. EMG evaluation of both upper extremities were within normal limits, no evidence of an overlying cervical radiculopathy was seen on either side.  Marlan Palau. Keith Cody Oliger MD 07/04/2017 3:39 PM  [facility] [address] [place], [place] [id number]  Phone [phone] Fax [phone]

## 2017-07-04 NOTE — Progress Notes (Signed)
     [facility]    Nerve / Sites Muscle Latency Ref. Amplitude Ref. Rel Amp Segments Distance Velocity Ref. Area    ms ms mV mV %  cm m/s m/s mVms  L Median - APB     Wrist APB 2.6 ?4.4 15.5 ?4.0 100 Wrist - APB 7   56.1     Upper arm APB 6.3  9.3  60.2 Upper arm - Wrist 23 62 ?49 33.1  R Median - APB     Wrist APB 2.8 ?4.4 13.6 ?4.0 100 Wrist - APB 7   40.2     Upper arm APB 6.1  12.6  92.6 Upper arm - Wrist 22 66 ?49 39.4  L Ulnar - ADM     Wrist ADM 2.2 ?3.3 12.9 ?6.0 100 Wrist - ADM 7   35.8     B.Elbow ADM 5.5  11.4  88.7 B.Elbow - Wrist 200 600 ?49 35.2     A.Elbow ADM 7.0  9.7  85.2 A.Elbow - B.Elbow 10 66 ?49 32.6         A.Elbow - Wrist      R Ulnar - ADM     Wrist ADM 2.1 ?3.3 11.4 ?6.0 100 Wrist - ADM 7   33.1     B.Elbow ADM 5.5  11.7  103 B.Elbow - Wrist 21 62 ?49 34.2     A.Elbow ADM 6.8  10.7  92 A.Elbow - B.Elbow 9 66 ?49 33.3         A.Elbow - Wrist                 SNC    Nerve / Sites Rec. Site Peak Lat Amp Segments Distance    ms V  cm  L Median - Orthodromic (Dig II, Mid palm)     Dig II Wrist 2.8 79 Dig II - Wrist 13  R Median - Orthodromic (Dig II, Mid palm)     Dig II Wrist 2.9 69 Dig II - Wrist 13  L Ulnar - Orthodromic, (Dig V, Mid palm)     Dig V Wrist 2.6 80 Dig V - Wrist 11  R Ulnar - Orthodromic, (Dig V, Mid palm)     Dig V Wrist 2.6 60 Dig V - Wrist 11             F  Wave    Nerve F Lat Ref.   ms ms  L Median - APB 22.8 ?31.0  L Ulnar - ADM 24.2 ?32.0  R Median - APB  ?31.0  R Ulnar - ADM 23.6 ?32.0

## 2017-07-04 NOTE — Progress Notes (Signed)
Please refer EMG and nerve conduction study procedure note. 

## 2017-07-05 ENCOUNTER — Ambulatory Visit: Payer: Medicaid Other | Admitting: Sports Medicine

## 2017-07-11 ENCOUNTER — Ambulatory Visit: Payer: Medicaid Other

## 2019-08-17 IMAGING — MR MR LUMBAR SPINE W/O CM
4 of 5 series · 26 of 48 positions shown · non-contrast
Comparison: Prior radiograph from 03/27/2017 as well as previous
MRI from 11/10/2012.

CLINICAL DATA: Initial evaluation for chronic back pain extending
down left leg to knee with numbness and tingling.

EXAM:
MRI LUMBAR SPINE WITHOUT CONTRAST
TECHNIQUE: Multiplanar, multisequence MR imaging of the lumbar spine was
performed. No intravenous contrast was administered.

[Series 2: T2 · sagittal · 4.0mm · 0.81mm/px · 6 of 15 slices shown (1 of 2)]
[im 1/15]
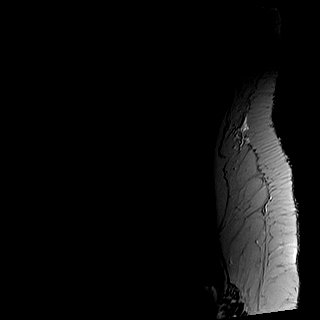
[im 3/15]
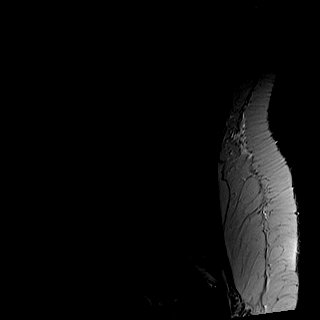
[im 6/15]
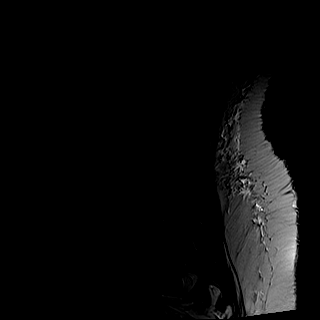
[im 9/15]
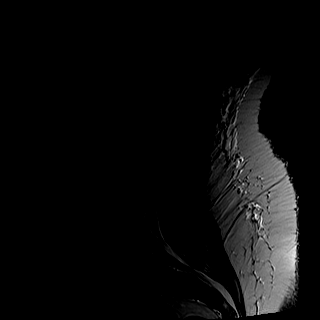
[im 12/15]
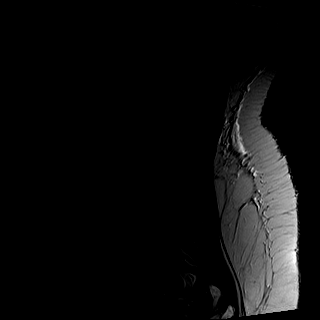
[im 15/15]
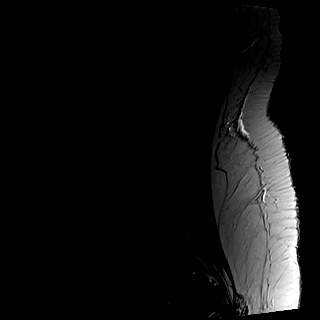

[Series 3: T1 · sagittal · 4.0mm · 0.41mm/px · 6 of 15 slices shown (1 of 2)]
[im 1/15]
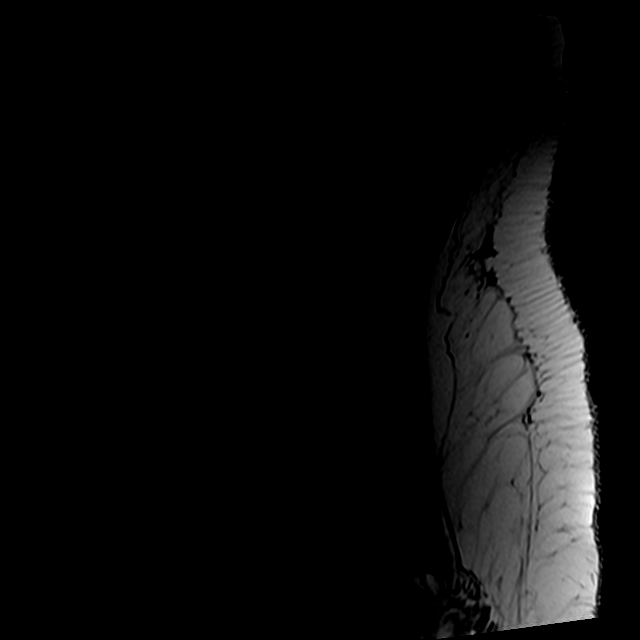
[im 3/15]
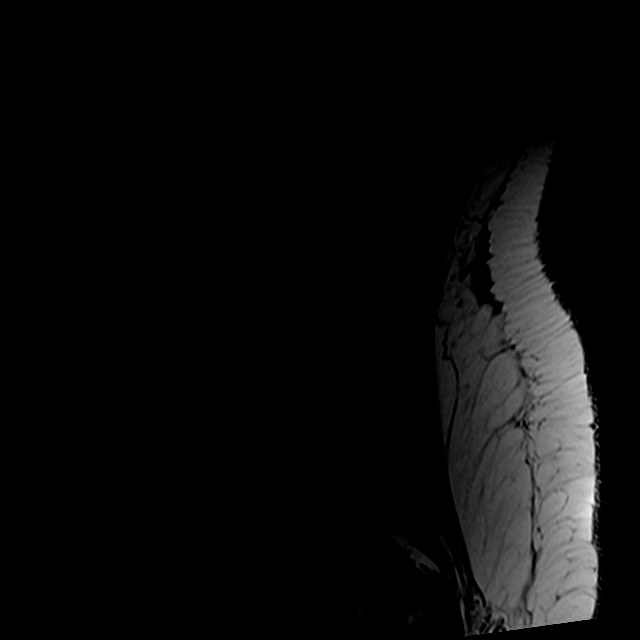
[im 6/15]
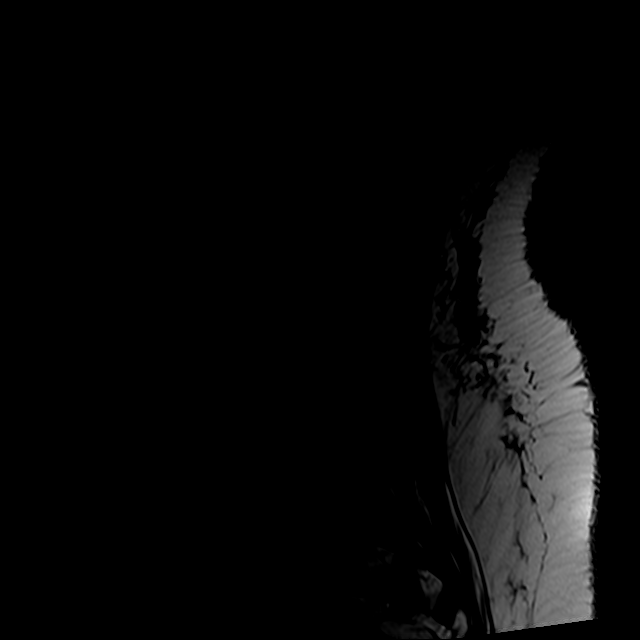
[im 9/15]
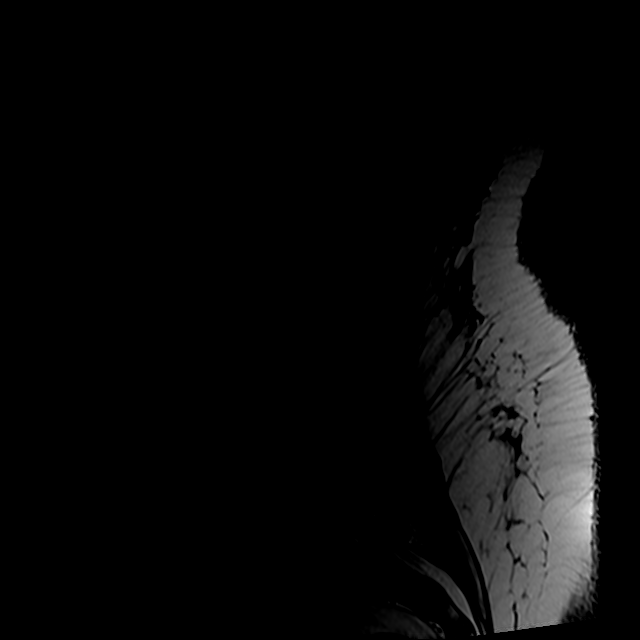
[im 12/15]
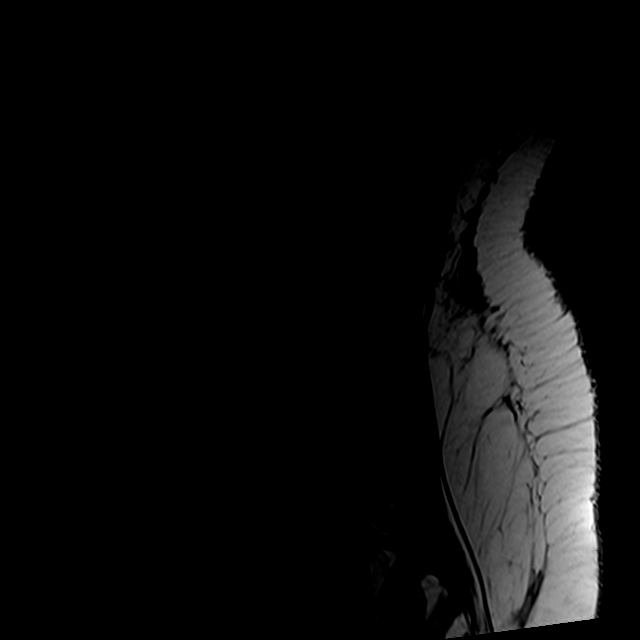
[im 15/15]
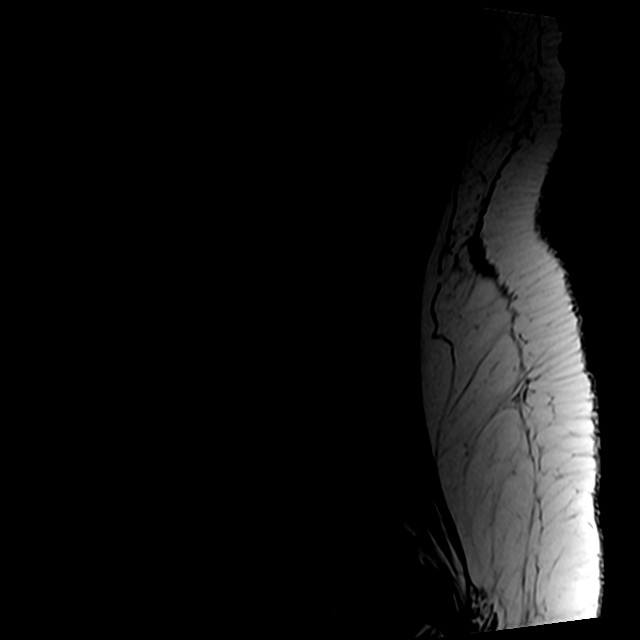

[Series 5: T2 · axial · 4.0mm · 0.78mm/px · z∈[-65,+141]mm · 9 of 38 slices shown (2 of 2)]
[im 1/38]
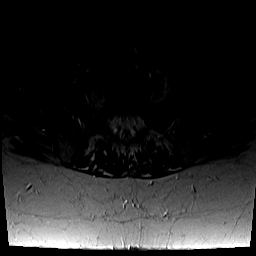
[im 6/38]
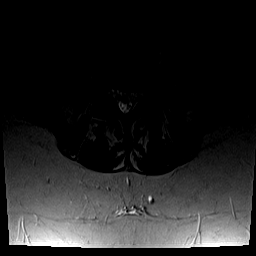
[im 11/38]
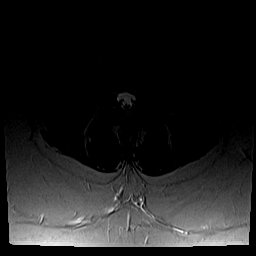
[im 16/38]
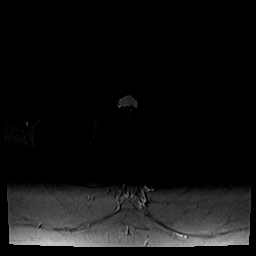
[im 19/38]
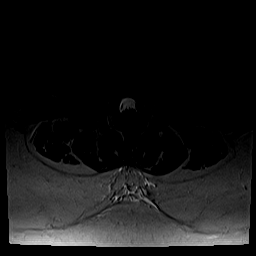
[im 22/38]
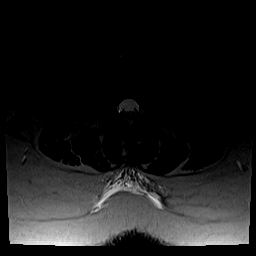
[im 27/38]
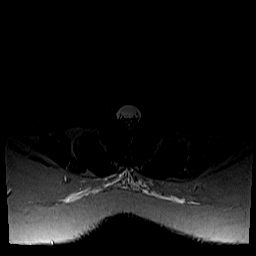
[im 32/38]
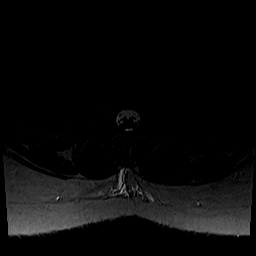
[im 38/38]
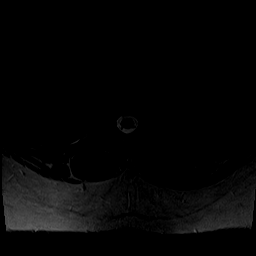

[Series 6: T1 · axial · 4.0mm · 0.39mm/px · z∈[-65,+112]mm · 5 of 38 slices shown (2 of 2)]
[im 1/38]
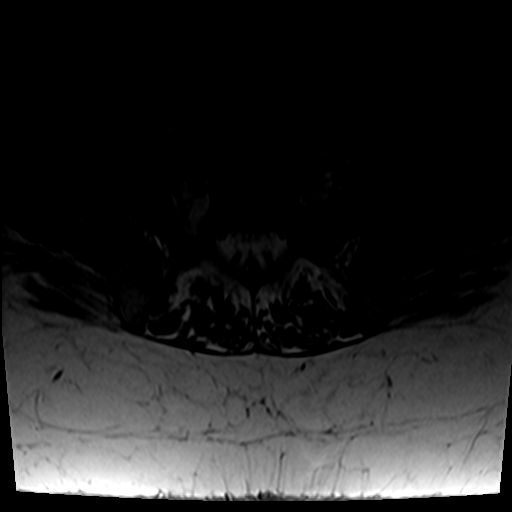
[im 6/38]
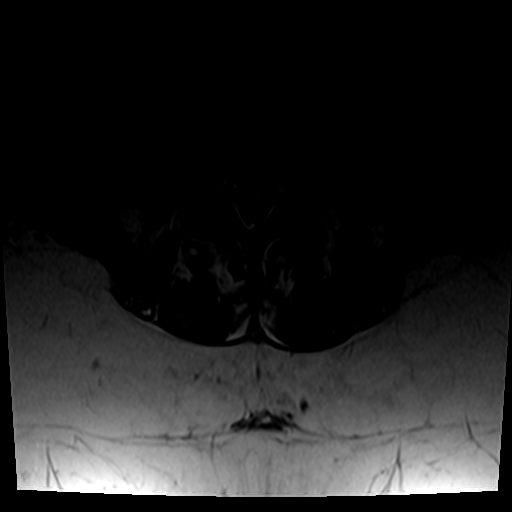
[im 11/38]
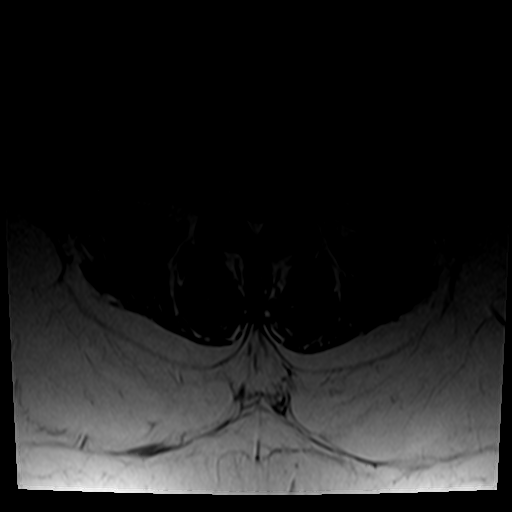
[im 19/38]
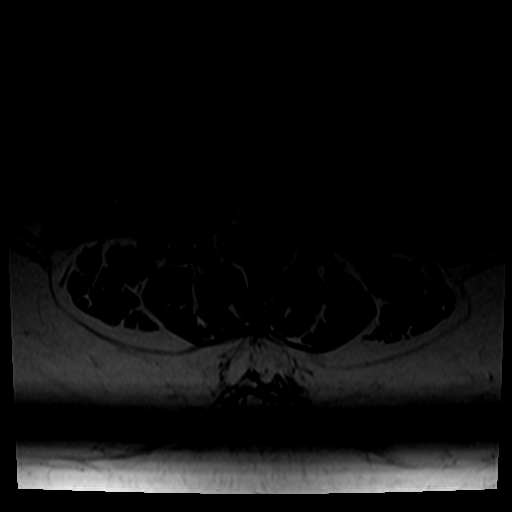
[im 32/38]
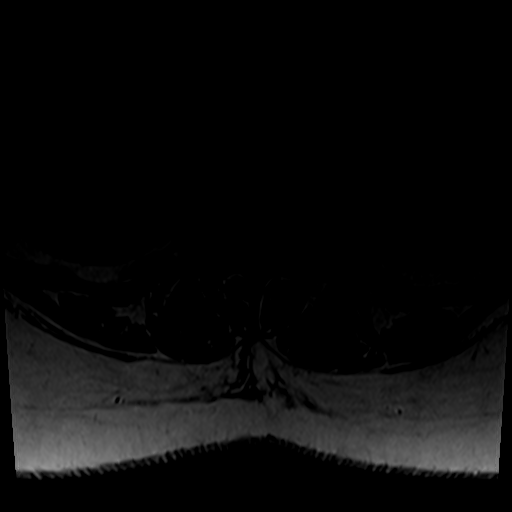

[26 of 48 positions shown; findings below may reference images not displayed]

FINDINGS: Segmentation: Transitional lumbosacral anatomy with sacralization of
the L5 vertebral body. Same numbering system is employed as on
previous exam.

Alignment: Mild exaggeration of the normal lumbar lordosis, stable
previous. No listhesis.

Vertebrae: Vertebral body heights are maintained. No evidence for
acute or chronic fracture. Bone marrow signal intensity within
normal limits. No discrete or worrisome osseous lesions. No abnormal
marrow edema.

Conus medullaris: Extends to the T12-L1 level and appears normal.

Paraspinal and other soft tissues: Paraspinous soft tissues within
normal limits. Visualized visceral structures are normal.

Disc levels:

L1-2:  Unremarkable.

L2-3:  Unremarkable.

L3-4:  Mild epidural lipomatosis.  Otherwise unremarkable.

L4-5: Mild diffuse disc bulge with disc desiccation. Persistent tiny
central disc protrusion, largely regressed as compared to previous
exam. No stenosis or neural impingement.

L5-S1: Transitional lumbosacral anatomy with rudimentary L5-S1 disc.
No stenosis.
IMPRESSION: 1. Transitional lumbosacral anatomy with sacralization of the L5
vertebral body.
2. Degenerative disc bulging with tiny central disc protrusion at
L4-5 without stenosis or neural impingement. This has regressed and
is less prominent as compared to previous MRI from [DATE].
